# Patient Record
Sex: Female | Born: 1980
Health system: Southern US, Community
[De-identification: ages and names within clinical notes are randomized; demographics above are authoritative.]

## PROBLEM LIST (undated history)

## (undated) DIAGNOSIS — F329 Major depressive disorder, single episode, unspecified: Secondary | ICD-10-CM

## (undated) DIAGNOSIS — R519 Headache, unspecified: Secondary | ICD-10-CM

## (undated) DIAGNOSIS — R51 Headache: Secondary | ICD-10-CM

## (undated) DIAGNOSIS — F32A Depression, unspecified: Secondary | ICD-10-CM

## (undated) HISTORY — DX: Headache: R51

## (undated) HISTORY — PX: ECTOPIC PREGNANCY SURGERY: SHX613

## (undated) HISTORY — DX: Depression, unspecified: F32.A

## (undated) HISTORY — DX: Major depressive disorder, single episode, unspecified: F32.9

## (undated) HISTORY — DX: Headache, unspecified: R51.9

---

## 2004-07-04 ENCOUNTER — Ambulatory Visit: Payer: Self-pay | Admitting: Unknown Physician Specialty

## 2005-08-19 ENCOUNTER — Emergency Department: Payer: Self-pay | Admitting: Emergency Medicine

## 2015-04-02 LAB — HM PAP SMEAR

## 2015-04-11 ENCOUNTER — Encounter: Payer: Self-pay | Admitting: Family Medicine

## 2015-04-11 ENCOUNTER — Ambulatory Visit (INDEPENDENT_AMBULATORY_CARE_PROVIDER_SITE_OTHER): Payer: Managed Care, Other (non HMO) | Admitting: Family Medicine

## 2015-04-11 VITALS — BP 102/72 | HR 95 | Temp 98.2°F | Ht 64.0 in | Wt 160.2 lb

## 2015-04-11 DIAGNOSIS — F339 Major depressive disorder, recurrent, unspecified: Secondary | ICD-10-CM

## 2015-04-11 MED ORDER — DULOXETINE HCL 60 MG PO CPEP: 60.0000 mg | ORAL_CAPSULE | Freq: Every day | ORAL | Status: DC

## 2015-04-11 MED ORDER — DULOXETINE HCL 30 MG PO CPEP: ORAL_CAPSULE | ORAL | Status: DC

## 2015-04-11 NOTE — Patient Instructions (Signed)
I'll check on counseling appointments.  Start cymbalta 30mg  a day for 2 weeks, then 60mg  thereafter.  Update me in about 2 weeks, sooner if needed.  We may still need to add on wellbutrin at a later point.   Take care.  Glad to see you.

## 2015-04-11 NOTE — Progress Notes (Signed)
Pre visit review using our clinic review tool, if applicable. No additional management support is needed unless otherwise documented below in the visit note.  New patient. H/o anxiety and depression for years, dating back to teenage years.  Prev on zoloft, but had a flat affect with that med.  Weight gain with celexa. Prev had improvement with 30mg --->60mg  cymbalta with wellbutrin also.  Off meds now. H/o SA prev, no SI/HI now.  Has family support with her uncle.  No substance/etoh use.  Working.  Single.  Can get tearful, worried, withdrawn, with sleep changes.  She wants to restart counseling, had gone prev.    She doesn't have h/o hallucinations, manic episodes.    Health maintenance d/w pt.  Tetanus 2010, flu 2016 at work, Pap 2016 at GYN clinic.   PMH and SH reviewed  ROS: See HPI, otherwise noncontributory.  Meds, vitals, and allergies reviewed.   GEN: nad, alert and oriented HEENT: mucous membranes moist NECK: supple w/o LA CV: rrr. PULM: ctab, no inc wob EXT: no edema Affect slightly flat but speech and judgement wnl.

## 2015-04-11 NOTE — Assessment & Plan Note (Signed)
Will check on counseling for patient.  Still okay for outpatient f/u.  Will start 30mg  cymbalta for 2 weeks, then inc to 60mg .   Will likely need wellbutrin added back on, at a later day.  She'll update me in about 2 weeks, sooner if needed.  No SI/HI.  >30 minutes spent in face to face time with patient, >50% spent in counselling or coordination of care.

## 2015-04-13 ENCOUNTER — Other Ambulatory Visit: Payer: Self-pay | Admitting: Family Medicine

## 2015-04-13 DIAGNOSIS — F339 Major depressive disorder, recurrent, unspecified: Secondary | ICD-10-CM

## 2015-05-04 ENCOUNTER — Ambulatory Visit
Admission: RE | Admit: 2015-05-04 | Discharge: 2015-05-04 | Disposition: A | Discharge status: Home / Self Care | Payer: Managed Care, Other (non HMO) | Source: Ambulatory Visit | Attending: Infectious Diseases | Admitting: Infectious Diseases

## 2015-05-04 ENCOUNTER — Other Ambulatory Visit: Payer: Self-pay | Admitting: Infectious Diseases

## 2015-05-04 DIAGNOSIS — H5340 Unspecified visual field defects: Secondary | ICD-10-CM | POA: Insufficient documentation

## 2015-05-07 ENCOUNTER — Telehealth: Payer: Self-pay | Admitting: *Deleted

## 2015-05-07 NOTE — Telephone Encounter (Signed)
-----   Message from Joaquim Nam, MD sent at 05/06/2015  4:52 PM EST ----- Please get update on patient.  Was started on cymbalta.  Thanks.

## 2015-05-07 NOTE — Telephone Encounter (Signed)
-----   Message from Annamarie Major, [place] sent at 05/07/2015  8:30 AM EST ----- Left detailed message on voicemail to return call.  ----- Message -----    From: Joaquim Nam, MD    Sent: 05/06/2015   4:52 PM      To: Annamarie Major, CMA  Please get update on patient.  Was started on cymbalta.  Thanks.

## 2015-05-08 ENCOUNTER — Telehealth: Payer: Self-pay | Admitting: Family Medicine

## 2015-05-08 NOTE — Telephone Encounter (Signed)
-----   Message from Annamarie Major, [place] sent at 05/08/2015 12:47 PM EST ----- On 60 mg now and will start Wellbutrin tomorrow or Thursday.  Had some constipation at first but that resolved.  Patient is feeling better, not crying every day as before.  Pt has appt with Salley Scarlet in early February that she intends to keep. ----- Message -----    From: Annamarie Major, CMA    Sent: 05/07/2015   8:30 AM      To: Annamarie Major, CMA  Left detailed message on voicemail to return call.  ----- Message -----    From: Joaquim Nam, MD    Sent: 05/06/2015   4:52 PM      To: Annamarie Major, CMA  Please get update on patient.  Was started on cymbalta.  Thanks.

## 2015-05-08 NOTE — Telephone Encounter (Signed)
Noted. Thanks.

## 2015-05-11 ENCOUNTER — Telehealth: Payer: Self-pay

## 2015-05-11 NOTE — Telephone Encounter (Signed)
Pt called with update on condition; pt last seen 04/11/15; pt has appt to start therapy on 05/24/15. Pt is doing good on Cymbalta;no longer having constipation, pt can tell feeling a lot better; pt has not cried in last 3 weeks and pt is no longer angry.FYI to Dr Para March.

## 2015-05-13 NOTE — Telephone Encounter (Signed)
Noted. Thanks.

## 2015-05-24 ENCOUNTER — Ambulatory Visit (INDEPENDENT_AMBULATORY_CARE_PROVIDER_SITE_OTHER): Payer: Managed Care, Other (non HMO) | Admitting: Psychology

## 2015-05-24 DIAGNOSIS — F4323 Adjustment disorder with mixed anxiety and depressed mood: Secondary | ICD-10-CM

## 2015-06-28 ENCOUNTER — Ambulatory Visit (INDEPENDENT_AMBULATORY_CARE_PROVIDER_SITE_OTHER): Payer: Managed Care, Other (non HMO) | Admitting: Psychology

## 2015-06-28 DIAGNOSIS — F4323 Adjustment disorder with mixed anxiety and depressed mood: Secondary | ICD-10-CM | POA: Diagnosis not present

## 2015-07-12 ENCOUNTER — Ambulatory Visit (INDEPENDENT_AMBULATORY_CARE_PROVIDER_SITE_OTHER): Payer: Managed Care, Other (non HMO) | Admitting: Psychology

## 2015-07-12 DIAGNOSIS — F4323 Adjustment disorder with mixed anxiety and depressed mood: Secondary | ICD-10-CM | POA: Diagnosis not present

## 2015-07-26 ENCOUNTER — Ambulatory Visit: Payer: Managed Care, Other (non HMO) | Admitting: Psychology

## 2015-08-09 ENCOUNTER — Ambulatory Visit: Payer: Managed Care, Other (non HMO) | Admitting: Psychology

## 2017-01-20 ENCOUNTER — Other Ambulatory Visit: Payer: Self-pay | Admitting: Physician Assistant

## 2017-01-20 DIAGNOSIS — M5412 Radiculopathy, cervical region: Secondary | ICD-10-CM

## 2017-01-21 ENCOUNTER — Encounter: Payer: Self-pay | Admitting: Radiology

## 2017-01-21 ENCOUNTER — Ambulatory Visit
Admission: RE | Admit: 2017-01-21 | Discharge: 2017-01-21 | Disposition: A | Discharge status: Home / Self Care | Payer: 59 | Source: Ambulatory Visit | Attending: Physician Assistant | Admitting: Physician Assistant

## 2017-01-21 DIAGNOSIS — M50322 Other cervical disc degeneration at C5-C6 level: Secondary | ICD-10-CM | POA: Diagnosis not present

## 2017-01-21 DIAGNOSIS — M5412 Radiculopathy, cervical region: Secondary | ICD-10-CM | POA: Insufficient documentation

## 2017-01-21 DIAGNOSIS — M4802 Spinal stenosis, cervical region: Secondary | ICD-10-CM | POA: Diagnosis not present

## 2017-02-06 IMAGING — CT CT HEAD W/O CM
1 series · 16 of 30 positions shown, 20 images · non-contrast
Comparison: None.

CLINICAL DATA: Visual field loss.  Started 830 this morning.

EXAM:
CT HEAD WITHOUT CONTRAST
TECHNIQUE: Contiguous axial images were obtained from the base of the skull
through the vertex without intravenous contrast.

[Series 2: head wo · axial · 0.38mm/px · z∈[+340,+466]mm · 16 of 32 slices shown, 20 images]
[im 2/32  brain]
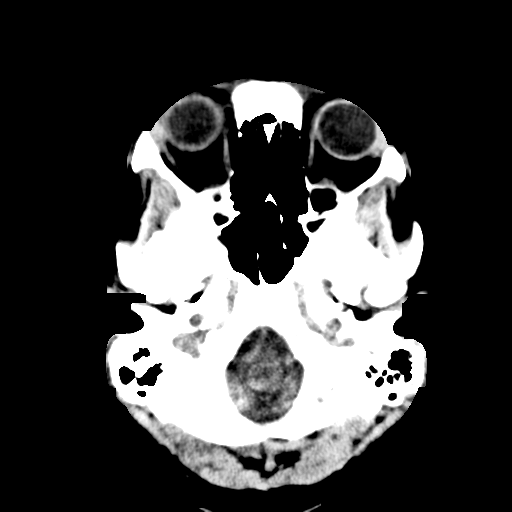
[im 2/32  bone]
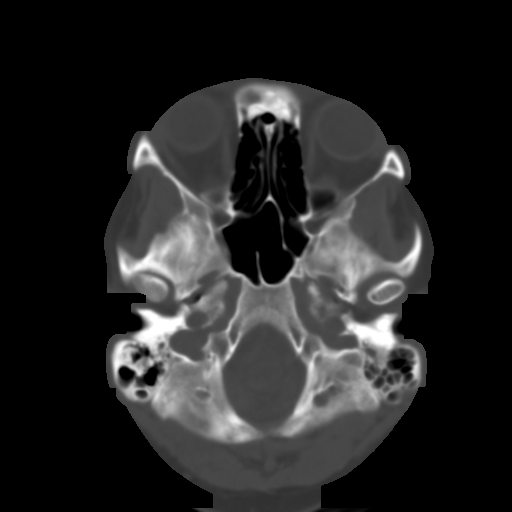
[im 4/32  brain]
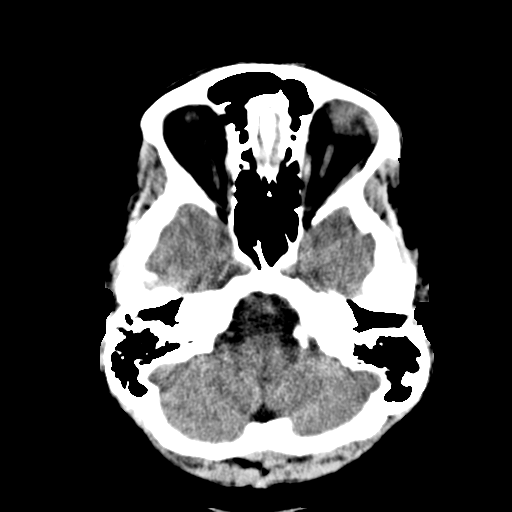
[im 6/32  brain]
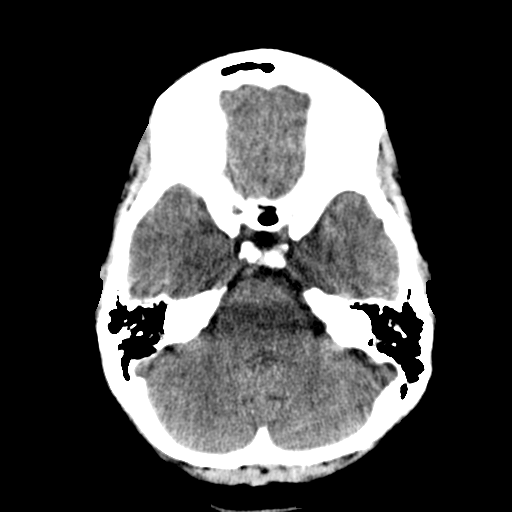
[im 8/32  brain]
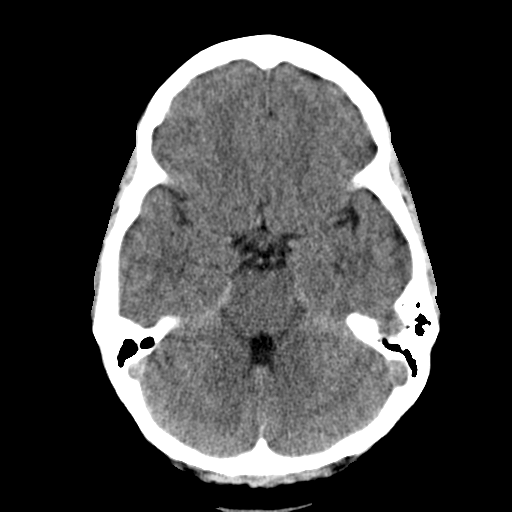
[im 9/32  brain]
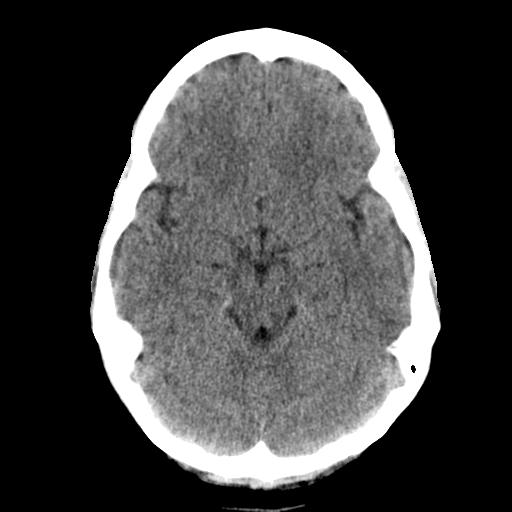
[im 9/32  bone]
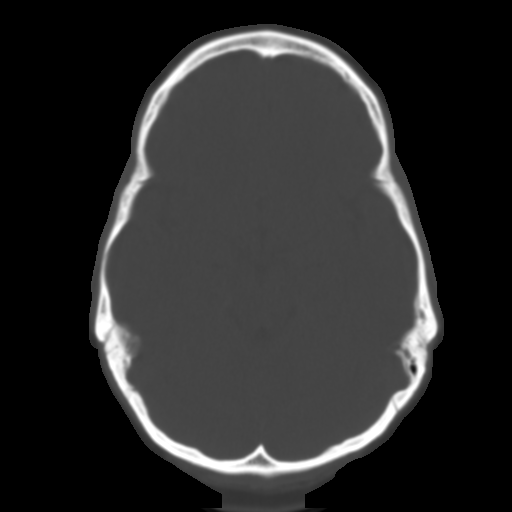
[im 11/32  brain]
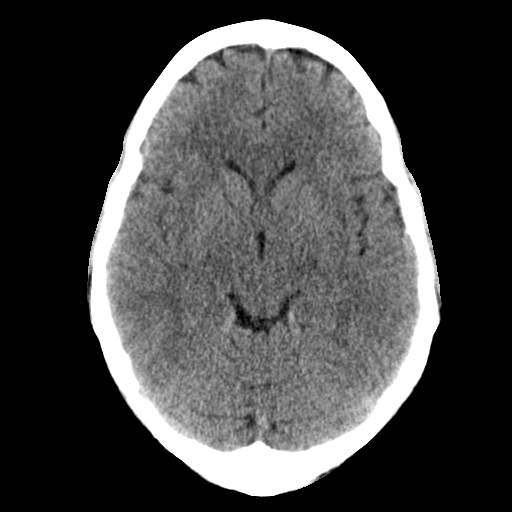
[im 13/32  brain]
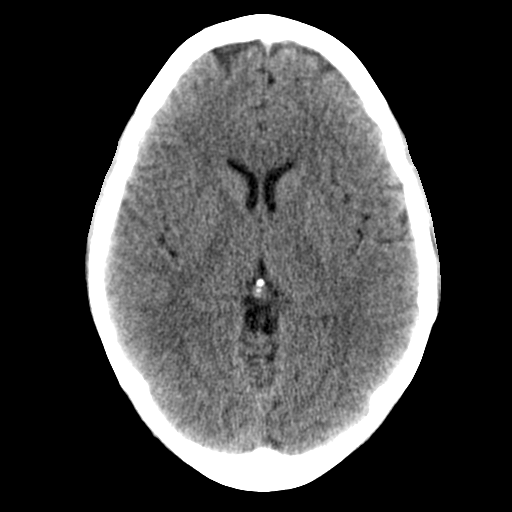
[im 15/32  brain]
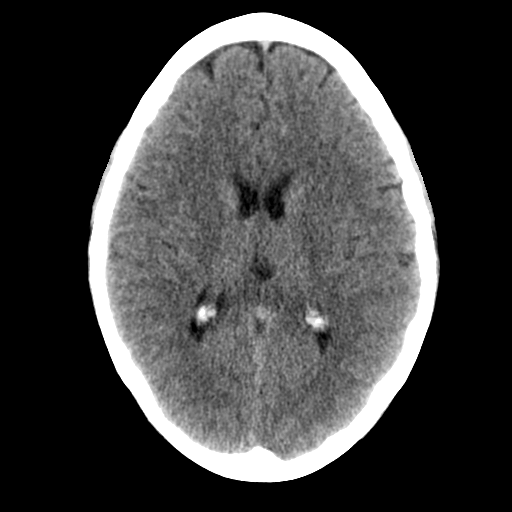
[im 17/32  brain]
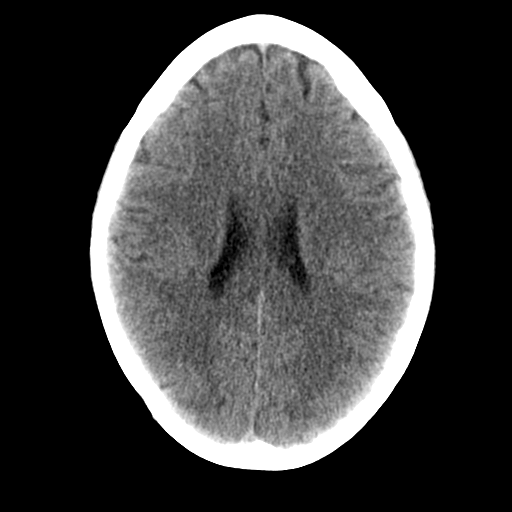
[im 17/32  bone]
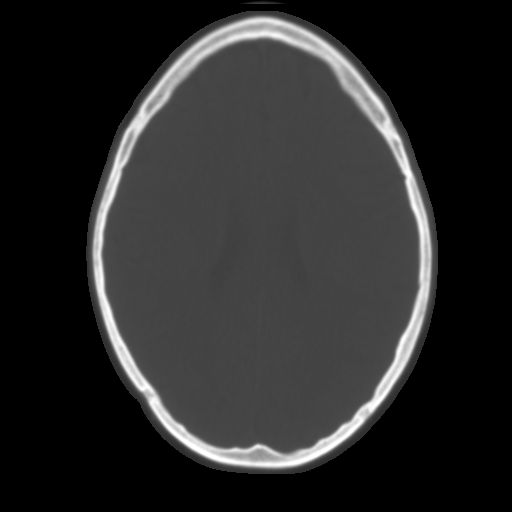
[im 19/32  brain]
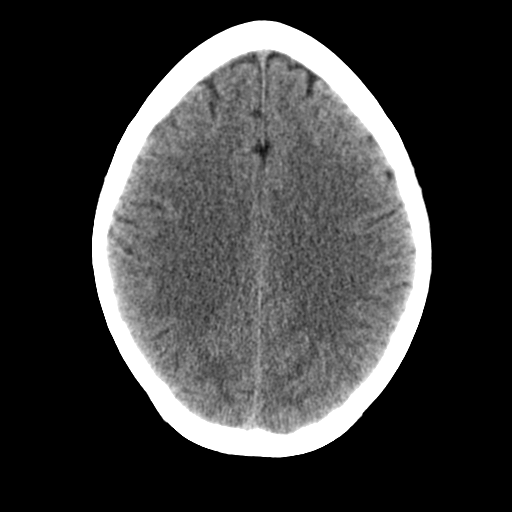
[im 21/32  brain]
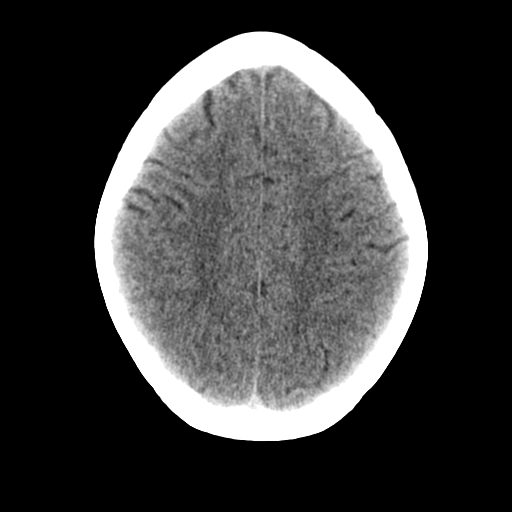
[im 23/32  brain]
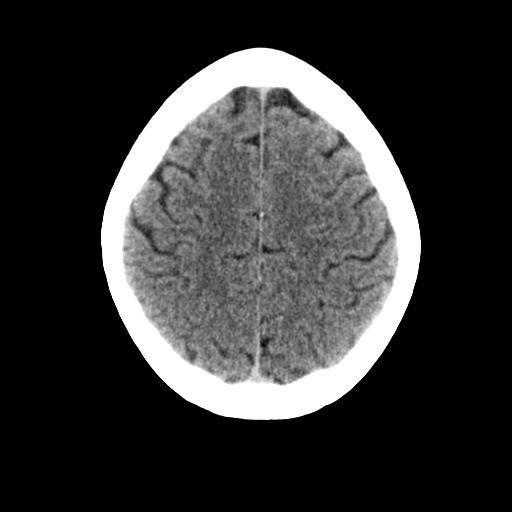
[im 24/32  brain]
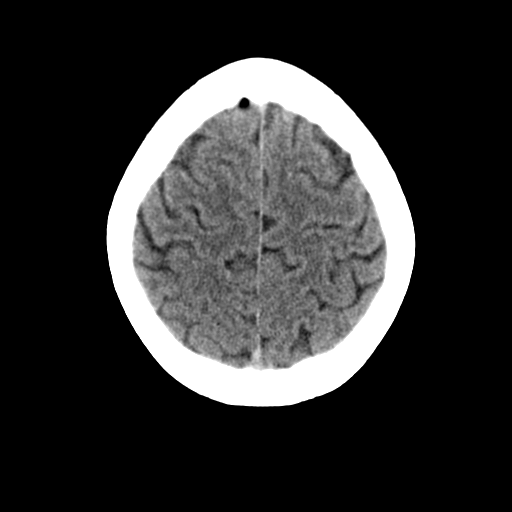
[im 24/32  bone]
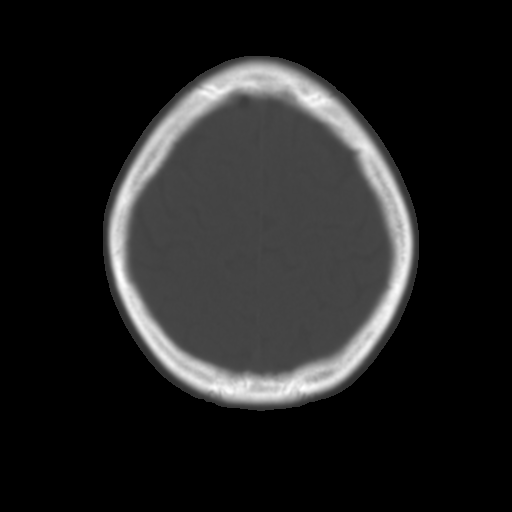
[im 26/32  brain]
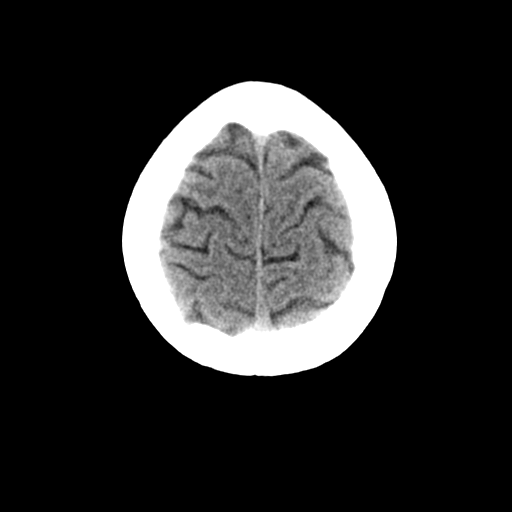
[im 28/32  brain]
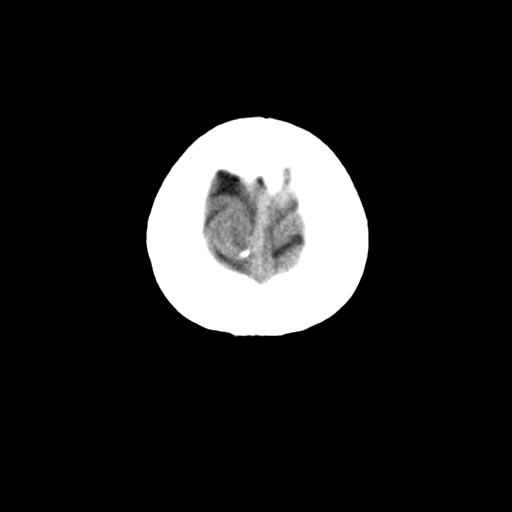
[im 30/32  brain]
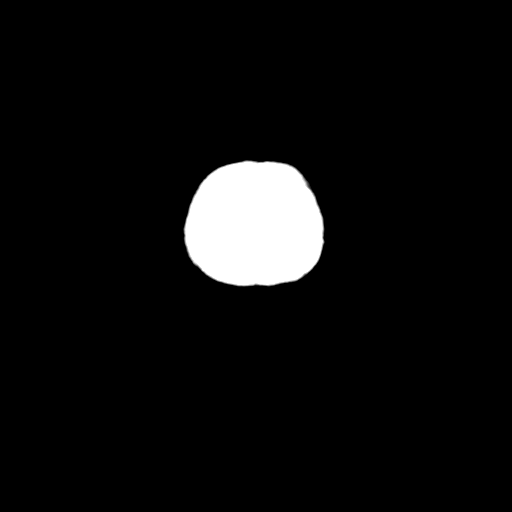

[16 of 30 positions shown; findings below may reference images not displayed]

FINDINGS: There is no evidence of mass effect, midline shift or extra-axial
fluid collections. There is no evidence of a space-occupying lesion
or intracranial hemorrhage. There is no evidence of a cortical-based
area of acute infarction.

The ventricles and sulci are appropriate for the patient's age. The
basal cisterns are patent.

Visualized portions of the orbits are unremarkable. The visualized
portions of the paranasal sinuses and mastoid air cells are
unremarkable.

The osseous structures are unremarkable.
IMPRESSION: Normal CT of the brain without intravenous contrast.

## 2021-12-23 ENCOUNTER — Ambulatory Visit: Payer: Self-pay | Admitting: Surgery

## 2021-12-23 NOTE — H&P (Signed)
Subjective:  CC: Grade III hemorrhoids [K64.2]   HPI:  Judy Wall is a 41 y.o. female who was referrred by Enid Baas, MD for above. Symptoms were first noted several days ago. Hx of previous hemorrhoidectomy.  This episode did not resolve with rest and preparation H.     Past Medical History:  has a past medical history of Anxiety and Ectopic pregnancy.  Past Surgical History:  has a past surgical history that includes Hemorrhoidectomy Internal & External; Pelvic laparoscopy; and Dilation and curettage of uterus.  Family History: family history includes Alcohol abuse in her maternal grandmother; COPD in her mother; Depression in her father; Hepatitis C in her mother; Schizophrenia in her mother.  Social History:  reports that she has quit smoking. She has never used smokeless tobacco. She reports that she does not drink alcohol and does not use drugs.  Current Medications: has a current medication list which includes the following prescription(s): gabapentin and lisdexamfetamine.  Allergies:  Allergies as of 12/23/2021 - Reviewed 12/23/2021  Allergen Reaction Noted   Citalopram Other (See Comments) 04/11/2015    ROS:  A 15 point review of systems was performed and pertinent positives and negatives noted in HPI  Objective:     BP 102/61   Pulse 84   Ht 162.6 cm (5\' 4" )   Wt 71.2 kg (157 lb)   LMP 12/11/2021   BMI 26.95 kg/m   Constitutional :  No distress, cooperative, alert  Lymphatics/Throat:  Supple with no lymphadenopathy  Respiratory:  Clear to auscultation bilaterally  Cardiovascular:  Regular rate and rhythm  Gastrointestinal: Soft, non-tender, non-distended, no organomegaly.  Musculoskeletal: Steady gait and movement  Skin: Cool and moist  Psychiatric: Normal affect, non-agitated, not confused  Rectal: Chaperone present for exam.  External exam noted to have several external hemorrhoids, with one larger one that is non-tender, no ulceration.  DRE  revealed, normal rectal tone, with palpable internal hemorrhoid noted at anterior and LL portion with minimal discomfort.  After obtaining verbal consent, an anoscope was inserted after prepped with lubricant and internal hemorrhoids noted on DRE confirmed visually, with no evidence of thrombosis. No other pathology such as fissures, fistulas, polyps noted.  Scope withdrawn and patient tolerate procedure well.       LABS:  N/A  RADS: N/A  Assessment:     Grade III hemorrhoids [K64.2]  Plan:    1. Grade III hemorrhoids [K64.2] Discussed risks/benefits/alternatives to surgery.  Alternatives include the options of observation, medical management.  Benefits include symptomatic relief.  I discussed  in detail and the complications related to the operation and the anesthesia, including bleeding, infection, recurrence, remote possibility of temporary or permanent fecal incontinence, poor/delayed wound healing, chronic pain, and additional procedures to address said risks. The risks of general anesthetic, if used, includes MI, CVA, sudden death or even reaction to anesthetic medications also discussed.   We also discussed typical post operative recovery which includes weeks to potentially months of anal pain, drainage, occasional bleeding, and sense of fecal urgency.    ED return precautions given for sudden increase in pain, bleeding, with possible accompanying fever, nausea, and/or vomiting.  The patient understands the risks, any and all questions were answered to the patient's satisfaction.  2. Patient has elected to proceed with surgical treatment. Procedure will be scheduled.  Likely out for 4wks postop  labs/images/medications/previous chart entries reviewed personally and relevant changes/updates noted above.

## 2022-05-08 ENCOUNTER — Other Ambulatory Visit: Payer: Self-pay | Admitting: Family Medicine

## 2022-05-08 DIAGNOSIS — Z1231 Encounter for screening mammogram for malignant neoplasm of breast: Secondary | ICD-10-CM

## 2022-05-13 ENCOUNTER — Institutional Professional Consult (permissible substitution): Payer: Managed Care, Other (non HMO) | Admitting: Plastic Surgery

## 2022-08-15 ENCOUNTER — Encounter: Payer: Self-pay | Admitting: Plastic Surgery

## 2022-08-15 ENCOUNTER — Ambulatory Visit (INDEPENDENT_AMBULATORY_CARE_PROVIDER_SITE_OTHER): Payer: Self-pay | Admitting: Plastic Surgery

## 2022-08-15 VITALS — BP 114/77 | HR 87 | Ht 64.0 in | Wt 163.0 lb

## 2022-08-15 DIAGNOSIS — Z719 Counseling, unspecified: Secondary | ICD-10-CM

## 2022-08-15 NOTE — Progress Notes (Signed)
Patient ID: Judy Wall, female    DOB: 1980/05/09, 42 y.o.   MRN: [id number]   Chief Complaint  Patient presents with   Consult    The patient is a 42 year old female here with a friend for evaluation of several areas.  Her #1 complaint is her neck and chin area.  She says it is genetic and that most of the family has the same neck.  She is also interested in liposuction of the arms and inner thighs.  She would like excision of 2 changing skin lesions on her face.  She is interested in facial rejuvenation.  She is not planning on having kids and has not had any.  She does not like the way her breasts look and might be interested in a mastopexy.  She has a little bit of a tubular breast tightening at the inframammary fold of both breasts.    Review of Systems  Constitutional: Negative.   HENT: Negative.    Eyes: Negative.   Respiratory: Negative.  Negative for chest tightness and shortness of breath.   Cardiovascular: Negative.   Gastrointestinal: Negative.   Endocrine: Negative.   Genitourinary: Negative.   Musculoskeletal: Negative.     Past Medical History:  Diagnosis Date   Depression    Frequent headaches     Past Surgical History:  Procedure Laterality Date   ECTOPIC PREGNANCY SURGERY     x2      Current Outpatient Medications:    cholecalciferol (VITAMIN D3) 25 MCG (1000 UNIT) tablet, Take 1,000 Units by mouth daily., Disp: , Rfl:    ibuprofen (ADVIL) 200 MG tablet, Take 200 mg by mouth every 6 (six) hours as needed., Disp: , Rfl:    lisdexamfetamine (VYVANSE) 30 MG capsule, Take 1 capsule by mouth every morning., Disp: , Rfl:    Objective:   Vitals:   08/15/22 1353  BP: 114/77  Pulse: 87  SpO2: 98%    Physical Exam Vitals and nursing note reviewed.  Constitutional:      Appearance: Normal appearance.  HENT:     Head: Normocephalic and atraumatic.  Cardiovascular:     Rate and Rhythm: Normal rate.     Pulses: Normal pulses.  Pulmonary:      Effort: Pulmonary effort is normal.  Musculoskeletal:        General: No swelling or deformity.  Skin:    General: Skin is warm.     Capillary Refill: Capillary refill takes less than 2 seconds.     Coloration: Skin is not jaundiced.     Findings: No bruising.  Neurological:     Mental Status: She is alert and oriented to person, place, and time.  Psychiatric:        Mood and Affect: Mood normal.        Behavior: Behavior normal.        Thought Content: Thought content normal.        Judgment: Judgment normal.     Assessment & Plan:  Encounter for counseling  We talked about [place] and liposuction for her chin and neck area.  She is a candidate for a brachioplasty with liposuction.  I explained the scarring that that does not go away.  She is also a candidate for inner thigh liposuction.  We can set up for excision of her 2 skin changing lesions of her face/moles.  That could be done in the office.  She is a very good candidate for laser with  starting with the BBL and going to the halo or even TRL would be a really nice option for her.  Requested quotes for the information and we will provide that for her.  Alena Bills Tametha Banning, DO

## 2022-08-27 ENCOUNTER — Encounter: Payer: Self-pay | Admitting: *Deleted

## 2022-10-28 ENCOUNTER — Other Ambulatory Visit: Payer: Self-pay | Admitting: Family Medicine

## 2022-10-28 DIAGNOSIS — R200 Anesthesia of skin: Secondary | ICD-10-CM

## 2022-10-28 DIAGNOSIS — G8929 Other chronic pain: Secondary | ICD-10-CM

## 2022-10-28 DIAGNOSIS — M542 Cervicalgia: Secondary | ICD-10-CM

## 2022-11-03 ENCOUNTER — Telehealth: Payer: Self-pay | Admitting: *Deleted

## 2022-11-03 NOTE — Telephone Encounter (Signed)
Surgery quote mailed to patient with instructions to call scheduler if she is ready to move forward with scheduling surgery. Next steps are now in patients hands.

## 2022-11-08 ENCOUNTER — Ambulatory Visit
Admission: RE | Admit: 2022-11-08 | Discharge: 2022-11-08 | Disposition: A | Discharge status: Home / Self Care | Payer: Self-pay | Source: Ambulatory Visit | Attending: Family Medicine | Admitting: Family Medicine

## 2022-11-08 DIAGNOSIS — G8929 Other chronic pain: Secondary | ICD-10-CM

## 2022-11-08 DIAGNOSIS — R202 Paresthesia of skin: Secondary | ICD-10-CM

## 2022-11-08 DIAGNOSIS — R200 Anesthesia of skin: Secondary | ICD-10-CM

## 2022-11-08 DIAGNOSIS — M542 Cervicalgia: Secondary | ICD-10-CM

## 2022-12-24 ENCOUNTER — Encounter: Payer: Self-pay | Admitting: Family Medicine

## 2024-02-29 ENCOUNTER — Encounter: Admission: RE | Payer: Self-pay

## 2024-02-29 ENCOUNTER — Ambulatory Visit: Admission: RE | Admit: 2024-02-29 | Source: Home / Self Care | Admitting: Gastroenterology

## 2024-02-29 SURGERY — COLONOSCOPY
Anesthesia: General
